# Patient Record
Sex: Female | Born: 2000 | Race: White | Hispanic: No | Marital: Single | Smoking: Never smoker
Health system: Southern US, Community
[De-identification: ages and names within clinical notes are randomized; demographics above are authoritative.]

## PROBLEM LIST (undated history)

## (undated) DIAGNOSIS — T7840XA Allergy, unspecified, initial encounter: Secondary | ICD-10-CM

## (undated) HISTORY — DX: Allergy, unspecified, initial encounter: T78.40XA

## (undated) NOTE — *Deleted (*Deleted)
Preventive Care 18-21 Years Old, Female Preventive care refers to lifestyle choices and visits with your health care provider that can promote health and wellness. At this stage in your life, you may start seeing a primary care physician instead of a pediatrician. Your health care is now your responsibility. Preventive care for young adults includes:  A yearly physical exam. This is also called an annual wellness visit.  Regular dental and eye exams.  Immunizations.  Screening for certain conditions.  Healthy lifestyle choices, such as diet and exercise. What can I expect for my preventive care visit? Physical exam Your health care provider may check:  Height and weight. These may be used to calculate body mass index (BMI), which is a measurement that tells if you are at a healthy weight.  Heart rate and blood pressure.  Body temperature. Counseling Your health care provider may ask you questions about:  Past medical problems and family medical history.  Alcohol, tobacco, and drug use.  Home and relationship well-being.  Access to firearms.  Emotional well-being.  Diet, exercise, and sleep habits.  Sexual activity and sexual health.  Method of birth control.  Menstrual cycle.  Pregnancy history. What immunizations do I need?  Influenza (flu) vaccine  This is recommended every year. Tetanus, diphtheria, and pertussis (Tdap) vaccine  You may need a Td booster every 10 years. Varicella (chickenpox) vaccine  You may need this vaccine if you have not already been vaccinated. Human papillomavirus (HPV) vaccine  If recommended by your health care provider, you may need three doses over 6 months. Measles, mumps, and rubella (MMR) vaccine  You may need at least one dose of MMR. You may also need a second dose. Meningococcal conjugate (MenACWY) vaccine  One dose is recommended if you are 19-21 years old and a first-year college student living in a residence hall,  or if you have one of several medical conditions. You may also need additional booster doses. Pneumococcal conjugate (PCV13) vaccine  You may need this if you have certain conditions and were not previously vaccinated. Pneumococcal polysaccharide (PPSV23) vaccine  You may need one or two doses if you smoke cigarettes or if you have certain conditions. Hepatitis A vaccine  You may need this if you have certain conditions or if you travel or work in places where you may be exposed to hepatitis A. Hepatitis B vaccine  You may need this if you have certain conditions or if you travel or work in places where you may be exposed to hepatitis B. Haemophilus influenzae type b (Hib) vaccine  You may need this if you have certain risk factors. You may receive vaccines as individual doses or as more than one vaccine together in one shot (combination vaccines). Talk with your health care provider about the risks and benefits of combination vaccines. What tests do I need? Blood tests  Lipid and cholesterol levels. These may be checked every 5 years starting at age 20.  Hepatitis C test.  Hepatitis B test. Screening  Pelvic exam and Pap test. This may be done every 3 years starting at age 21.  Sexually transmitted disease (STD) testing, if you are at risk.  BRCA-related cancer screening. This may be done if you have a family history of breast, ovarian, tubal, or peritoneal cancers. Other tests  Tuberculosis skin test.  Vision and hearing tests.  Skin exam.  Breast exam. Follow these instructions at home: Eating and drinking   Eat a diet that includes fresh fruits and   vegetables, whole grains, lean protein, and low-fat dairy products.  Drink enough fluid to keep your urine pale yellow.  Do not drink alcohol if: ? Your health care provider tells you not to drink. ? You are pregnant, may be pregnant, or are planning to become pregnant. ? You are under the legal drinking age. In the  [place] the legal drinking age is 21.  If you drink alcohol: ? Limit how much you have to 0-1 drink a day. ? Be aware of how much alcohol is in your drink. In the [place] one drink equals one 12 oz bottle of beer (355 mL), one 5 oz glass of wine (148 mL), or one 1 oz glass of hard liquor (44 mL). Lifestyle  Take daily care of your teeth and gums.  Stay active. Exercise at least 30 minutes 5 or more days of the week.  Do not use any products that contain nicotine or tobacco, such as cigarettes, e-cigarettes, and chewing tobacco. If you need help quitting, ask your health care provider.  Do not use drugs.  If you are sexually active, practice safe sex. Use a condom or other form of birth control (contraception) in order to prevent pregnancy and STIs (sexually transmitted infections). If you plan to become pregnant, see your health care provider for a pre-conception visit.  Find healthy ways to cope with stress, such as: ? Meditation, yoga, or listening to music. ? Journaling. ? Talking to a trusted person. ? Spending time with friends and family. Safety  Always wear your seat belt while driving or riding in a vehicle.  Do not drive if you have been drinking alcohol. Do not ride with someone who has been drinking.  Do not drive when you are tired or distracted. Do not text while driving.  Wear a helmet and other protective equipment during sports activities.  If you have firearms in your house, make sure you follow all gun safety procedures.  Seek help if you have been bullied, physically abused, or sexually abused.  Use the Internet responsibly to avoid dangers such as online bullying and online sex predators. What's next?  Go to your health care provider once a year for a well check visit.  Ask your health care provider how often you should have your eyes and teeth checked.  Stay up to date on all vaccines. This information is not intended to replace advice given to you by  your health care provider. Make sure you discuss any questions you have with your health care provider. Document Revised: 08/04/2018 Document Reviewed: 08/04/2018 Elsevier Patient Education  2020 Elsevier Inc.  

---
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
## 2020-02-16 ENCOUNTER — Other Ambulatory Visit: Payer: Self-pay

## 2020-02-16 ENCOUNTER — Ambulatory Visit (INDEPENDENT_AMBULATORY_CARE_PROVIDER_SITE_OTHER): Payer: BC Managed Care – PPO | Admitting: Family Medicine

## 2020-02-16 ENCOUNTER — Encounter: Payer: Self-pay | Admitting: Family Medicine

## 2020-02-16 VITALS — BP 114/72 | HR 69 | Temp 97.1°F | Wt 110.0 lb

## 2020-02-16 DIAGNOSIS — Z309 Encounter for contraceptive management, unspecified: Secondary | ICD-10-CM | POA: Diagnosis not present

## 2020-02-16 MED ORDER — DROSPIRENONE-ETHINYL ESTRADIOL 3-0.02 MG PO TABS: 1.0000 | ORAL_TABLET | Freq: Every day | ORAL | 11 refills | Status: DC

## 2020-02-16 NOTE — Progress Notes (Signed)
I,Laura E Walsh,acting as a scribe for Lavon Paganini, MD.,have documented all relevant documentation on the behalf of Lavon Paganini, MD,as directed by  Lavon Paganini, MD while in the presence of Lavon Paganini, MD.    Established patient visit   Patient: Diamond Hoffman   DOB: [birth year]   19 y.o. Female  MRN: [id number] Visit Date: 02/16/2020  Today's healthcare provider: Lavon Paganini, MD   Chief Complaint  Patient presents with  . Contraception   Subjective    HPI  Pt would like to discuss options for OCP.  She states her current birth control is break through bleeding and bloating.  Libido is low as well.  Taking OCPs with good compliance without missed doses.  No h/o DVT/PE, nonsmoker  Patient Active Problem List   Diagnosis Date Noted  . Encounter for contraceptive management 02/16/2020   Social History   Tobacco Use  . Smoking status: Never Smoker  . Smokeless tobacco: Never Used  Vaping Use  . Vaping Use: Some days  Substance Use Topics  . Alcohol use: No  . Drug use: No   Allergies  Allergen Reactions  . Augmentin [Amoxicillin-Pot Clavulanate] Nausea And Vomiting  . Latex Rash     Medications: Outpatient Medications Prior to Visit  Medication Sig  . Norethindrone Acetate-Ethinyl Estradiol (JUNEL 1.5/30) 1.5-30 MG-MCG tablet Take 1 tablet by mouth daily.   No facility-administered medications prior to visit.    Review of Systems  Constitutional: Negative.   Genitourinary: Positive for menstrual problem. Negative for decreased urine volume, difficulty urinating, dyspareunia, dysuria, enuresis, flank pain, frequency, genital sores, hematuria, pelvic pain, urgency, vaginal bleeding, vaginal discharge and vaginal pain.      Objective    BP 114/72 (BP Location: Left Arm, Patient Position: Sitting, Cuff Size: Normal)   Pulse 69   Temp (!) 97.1 F (36.2 C) (Temporal)   Wt 110 lb (49.9 kg)   SpO2 99%   BMI 20.45 kg/m     Physical Exam Vitals and nursing note reviewed.  Constitutional:      Appearance: Normal appearance.  Cardiovascular:     Rate and Rhythm: Normal rate and regular rhythm.  Pulmonary:     Effort: Pulmonary effort is normal.     Breath sounds: Normal breath sounds.  Musculoskeletal:     Right lower leg: No edema.     Left lower leg: No edema.  Skin:    General: Skin is warm and dry.  Neurological:     Mental Status: She is alert and oriented to person, place, and time. Mental status is at baseline.  Psychiatric:        Mood and Affect: Mood normal.        Behavior: Behavior normal.        Thought Content: Thought content normal.        Judgment: Judgment normal.     No results found for any visits on 02/16/20.  Assessment & Plan     Problem List Items Addressed This Visit      Other   Encounter for contraceptive management - Primary    Side effects and BTB on Junel Will switch to Yaz instead No contraindications Advised on importance of compliance      Relevant Medications   drospirenone-ethinyl estradiol (YAZ) 3-0.02 MG tablet       Return in about 5 months (around 07/18/2020) for CPE.      I, Lavon Paganini, MD, have reviewed all documentation for  this visit. The documentation on 02/16/20 for the exam, diagnosis, procedures, and orders are all accurate and complete.   Philo Kurtz, Marzella Schlein, MD, MPH [facility]

## 2020-02-16 NOTE — Assessment & Plan Note (Signed)
Side effects and BTB on Junel Will switch to Yaz instead No contraindications Advised on importance of compliance

## 2020-02-16 NOTE — Patient Instructions (Signed)
Oral Contraception Use Oral contraceptive pills (OCPs) are medicines that you take to prevent pregnancy. OCPs work by:  Preventing the ovaries from releasing eggs.  Thickening mucus in the lower part of the uterus (cervix), which prevents sperm from entering the uterus.  Thinning the lining of the uterus (endometrium), which prevents a fertilized egg from attaching to the endometrium. OCPs are highly effective when taken exactly as prescribed. However, OCPs do not prevent sexually transmitted infections (STIs). Safe sex practices, such as using condoms while on an OCP, can help prevent STIs. Before taking OCPs, you may have a physical exam, blood test, and Pap test. A Pap test involves taking a sample of cells from your cervix to check for cancer. Discuss with your health care provider the possible side effects of the OCP you may be prescribed. When you start an OCP, be aware that it can take 2-3 months for your body to adjust to changes in hormone levels. How to take oral contraceptive pills Follow instructions from your health care provider about how to start taking your first cycle of OCPs. Your health care provider may recommend that you:  Start the pill on day 1 of your menstrual period. If you start at this time, you will not need any backup form of birth control (contraception), such as condoms.  Start the pill on the first Sunday after your menstrual period or on the day you get your prescription. In these cases, you will need to use backup contraception for the first week.  Start the pill at any time of your cycle. ? If you take the pill within 5 days of the start of your period, you will not need a backup form of contraception. ? If you start at any other time of your menstrual cycle, you will need to use another form of contraception for 7 days. If your OCP is the type called a minipill, it will protect you from pregnancy after taking it for 2 days (48 hours), and you can stop using  backup contraception after that time. After you have started taking OCPs:  If you forget to take 1 pill, take it as soon as you remember. Take the next pill at the regular time.  If you miss 2 or more pills, call your health care provider. Different pills have different instructions for missed doses. Use backup birth control until your next menstrual period starts.  If you use a 28-day pack that contains inactive pills and you miss 1 of the last 7 pills (pills with no hormones), throw away the rest of the non-hormone pills and start a new pill pack. No matter which day you start the OCP, you will always start a new pack on that same day of the week. Have an extra pack of OCPs and a backup contraceptive method available in case you miss some pills or lose your OCP pack. Follow these instructions at home:  Do not use any products that contain nicotine or tobacco, such as cigarettes and e-cigarettes. If you need help quitting, ask your health care provider.  Always use a condom to protect against STIs. OCPs do not protect against STIs.  Use a calendar to mark the days of your menstrual period.  Read the information and directions that came with your OCP. Talk to your health care provider if you have questions. Contact a health care provider if:  You develop nausea and vomiting.  You have abnormal vaginal discharge or bleeding.  You develop a rash.    You miss your menstrual period. Depending on the type of OCP you are taking, this may be a sign of pregnancy. Ask your health care provider for more information.  You are losing your hair.  You need treatment for mood swings or depression.  You get dizzy when taking the OCP.  You develop acne after taking the OCP.  You become pregnant or think you may be pregnant.  You have diarrhea, constipation, and abdominal pain or cramps.  You miss 2 or more pills. Get help right away if:  You develop chest pain.  You develop shortness of  breath.  You have an uncontrolled or severe headache.  You develop numbness or slurred speech.  You develop visual or speech problems.  You develop pain, redness, and swelling in your legs.  You develop weakness or numbness in your arms or legs. Summary  Oral contraceptive pills (OCPs) are medicines that you take to prevent pregnancy.  OCPs do not prevent sexually transmitted infections (STIs). Always use a condom to protect against STIs.  When you start an OCP, be aware that it can take 2-3 months for your body to adjust to changes in hormone levels.  Read all the information and directions that come with your OCP. This information is not intended to replace advice given to you by your health care provider. Make sure you discuss any questions you have with your health care provider. Document Revised: 12/02/2018 Document Reviewed: 09/21/2016 Elsevier Patient Education  2020 Elsevier Inc.  

## 2020-03-11 ENCOUNTER — Other Ambulatory Visit: Payer: Self-pay | Admitting: Family Medicine

## 2020-03-11 DIAGNOSIS — Z309 Encounter for contraceptive management, unspecified: Secondary | ICD-10-CM

## 2020-03-11 NOTE — Telephone Encounter (Signed)
Medication: drospirenone-ethinyl estradiol (YAZ) 3-0.02 MG tablet [[id number]]   Has the patient contacted their pharmacy? Yes  (Agent: If no, request that the patient contact the pharmacy for the refill.) (Agent: If yes, when and what did the pharmacy advise?)  Preferred Pharmacy (with phone number or street name): [id number] - [place], [place] - [address]  Phone:  [phone] Fax:  [phone]     Agent: Please be advised that RX refills may take up to 3 business days. We ask that you follow-up with your pharmacy.

## 2020-04-09 ENCOUNTER — Telehealth (INDEPENDENT_AMBULATORY_CARE_PROVIDER_SITE_OTHER): Payer: BC Managed Care – PPO | Admitting: Physician Assistant

## 2020-04-09 DIAGNOSIS — Z20822 Contact with and (suspected) exposure to covid-19: Secondary | ICD-10-CM

## 2020-04-09 NOTE — Progress Notes (Signed)
MyChart Video Visit    Virtual Visit via Video Note   This visit type was conducted due to national recommendations for restrictions regarding the COVID-19 Pandemic (e.g. social distancing) in an effort to limit this patient's exposure and mitigate transmission in our community. This patient is at least at moderate risk for complications without adequate follow up. This format is felt to be most appropriate for this patient at this time. Physical exam was limited by quality of the video and audio technology used for the visit.   Patient location: Home Provider location: Office    I discussed the limitations of evaluation and management by telemedicine and the availability of in person appointments. The patient expressed understanding and agreed to proceed.   Patient: Diamond Hoffman   DOB: 19-May-2001   19 y.o. Female  MRN: [id number] Visit Date: 04/09/2020  Today's healthcare provider: Trey Sailors, PA-C   Chief Complaint  Patient presents with  . Cough  . Sore Throat   Subjective    HPI  Patient presents today with URI symptoms. She has had symptoms for about 1 days. She was seen in the urgent care on 8/16 and was diagnosed with acute pharyngitis. She was tested for strep and it was negative. She reports that she still has sore throat and cough is work. She was not able to go to work today. She has had COVID 06/2019. She has not been vaccinated against COVID. She was tested for COVID on 04/06/2020.     Medications: Outpatient Medications Prior to Visit  Medication Sig  . drospirenone-ethinyl estradiol (YAZ) 3-0.02 MG tablet Take 1 tablet by mouth daily.   No facility-administered medications prior to visit.    Review of Systems  Constitutional: Positive for appetite change and fatigue.  HENT: Positive for congestion, postnasal drip, sore throat and trouble swallowing.   Respiratory: Positive for cough.   Gastrointestinal: Negative.   Musculoskeletal: Negative  for myalgias.  Neurological: Positive for headaches.      Objective    There were no vitals taken for this visit.   Physical Exam Constitutional:      Appearance: Normal appearance.  Pulmonary:     Effort: Pulmonary effort is normal. No respiratory distress.  Neurological:     Mental Status: She is alert.  Psychiatric:        Mood and Affect: Mood normal.        Behavior: Behavior normal.        Assessment & Plan    1. Suspected COVID-19 virus infection  - symptoms and exam c/w viral URI  - no evidence of strep pharyngitis, CAP, AOM, bacterial sinusitis, or other bacterial infection - concern for possible COVID19 infection - will send for outpatient testing - discussed need to quarantine 10 days from start of symptoms and until fever-free for at least 48 hours - discussed need to quarantine household members - discussed symptomatic management, natural course, and return precautions     Return if symptoms worsen or fail to improve.     I discussed the assessment and treatment plan with the patient. The patient was provided an opportunity to ask questions and all were answered. The patient agreed with the plan and demonstrated an understanding of the instructions.   The patient was advised to call back or seek an in-person evaluation if the symptoms worsen or if the condition fails to improve as anticipated.   ITrey Sailors, PA-C, have reviewed all documentation for this visit.  The documentation on 04/09/20 for the exam, diagnosis, procedures, and orders are all accurate and complete.  The entirety of the information documented in the History of Present Illness, Review of Systems and Physical Exam were personally obtained by me. Portions of this information were initially documented by [place], CMA and reviewed by me for thoroughness and accuracy.       Maryella Shivers [facility] [phone] (phone) [phone]  (fax)  [facility]

## 2020-05-02 ENCOUNTER — Ambulatory Visit: Payer: BC Managed Care – PPO | Admitting: Dermatology

## 2020-05-20 ENCOUNTER — Encounter: Payer: Self-pay | Admitting: Dermatology

## 2020-05-20 ENCOUNTER — Ambulatory Visit: Payer: BC Managed Care – PPO | Admitting: Dermatology

## 2020-05-20 ENCOUNTER — Other Ambulatory Visit: Payer: Self-pay

## 2020-05-20 VITALS — BP 111/69 | HR 88 | Wt 115.0 lb

## 2020-05-20 DIAGNOSIS — L700 Acne vulgaris: Secondary | ICD-10-CM | POA: Diagnosis not present

## 2020-05-20 MED ORDER — ADAPALENE 0.3 % EX GEL: CUTANEOUS | 1 refills | Status: DC

## 2020-05-20 MED ORDER — DOXYCYCLINE MONOHYDRATE 50 MG PO TABS: 50.0000 mg | ORAL_TABLET | Freq: Every day | ORAL | 1 refills | Status: DC

## 2020-05-20 MED ORDER — SPIRONOLACTONE 25 MG PO TABS: 25.0000 mg | ORAL_TABLET | Freq: Every day | ORAL | 1 refills | Status: DC

## 2020-05-20 NOTE — Patient Instructions (Addendum)
Recommend daily broad spectrum sunscreen SPF 30+ to sun-exposed areas, reapply every 2 hours as needed. Call for new or changing lesions.  Doxycycline should be taken with food to prevent nausea. Do not lay down for 30 minutes after taking. Be cautious with sun exposure and use good sun protection while on this medication. Pregnant women should not take this medication.   Spironolactone can cause increased urination and cause blood pressure to decrease. Please watch for signs of lightheadedness and be cautious when changing position. It can sometimes cause breast tenderness or an irregular period in premenopausal women. It can also increase potassium. The increase in potassium usually is not a concern unless you are taking other medicines that also increase potassium, so please be sure your doctor knows all of the other medications you are taking. This medication should not be taken  by pregnant women.   

## 2020-05-20 NOTE — Progress Notes (Signed)
   New Patient Visit  Subjective  Diamond Hoffman is a 19 y.o. female who presents for the following: New Patient (Initial Visit) (Acne, has been using OTC meds and face wash, not clearing up as she hoped. Patient has no h/o skin cancer.).  Patient presents today to re-establish care, patient has not been her in several years.  The following portions of the chart were reviewed this encounter and updated as appropriate:  Tobacco  Allergies  Meds  Problems  Med Hx  Surg Hx  Fam Hx     Review of Systems:  No other skin or systemic complaints except as noted in HPI or Assessment and Plan.  Objective  Well appearing patient in no apparent distress; mood and affect are within normal limits.  A focused examination was performed including Face. Relevant physical exam findings are noted in the Assessment and Plan.  Objective  Head - Anterior (Face): 15 + small to medium papules with comedones at mid to low face   Assessment & Plan  Acne vulgaris Head - Anterior (Face)  Start Spironolactone 25 mg 1 po QAM Start Doxycycline Monohydrate 50 mg 1 po with dinner Start adapalene 0.3% gel apply pea size amount to affected areas on face QHS  Continue Yaz as prescribed  BP: 111/69 P 88,  Weight 115 lb  Doxycycline should be taken with food to prevent nausea. Do not lay down for 30 minutes after taking. Be cautious with sun exposure and use good sun protection while on this medication. Pregnant women should not take this medication.   Spironolactone can cause increased urination and cause blood pressure to decrease. Please watch for signs of lightheadedness and be cautious when changing position. It can sometimes cause breast tenderness or an irregular period in premenopausal women. It can also increase potassium. The increase in potassium usually is not a concern unless you are taking other medicines that also increase potassium, so please be sure your doctor knows all of the other  medications you are taking. This medication should not be taken  by pregnant women.   spironolactone (ALDACTONE) 25 MG tablet - Head - Anterior (Face)  doxycycline (ADOXA) 50 MG tablet - Head - Anterior (Face)  Adapalene (DIFFERIN) 0.3 % gel - Head - Anterior (Face)  Return in 2 months (on 07/20/2020) for Acne.  Allen Norris, CMA, am acting as scribe for Armida Sans, MD . Documentation: I have reviewed the above documentation for accuracy and completeness, and I agree with the above.  Armida Sans, MD

## 2020-06-12 ENCOUNTER — Other Ambulatory Visit: Payer: Self-pay | Admitting: Dermatology

## 2020-06-12 DIAGNOSIS — L700 Acne vulgaris: Secondary | ICD-10-CM

## 2020-07-10 NOTE — Progress Notes (Deleted)
Complete physical exam   Patient: Diamond Hoffman   DOB: 2001/08/23   19 y.o. Female  MRN: [id number] Visit Date: 07/11/2020  Today's healthcare provider: Shirlee Latch, MD   No chief complaint on file.  Subjective    Diamond Hoffman is a 19 y.o. female who presents today for a complete physical exam.  She reports consuming a {diet types:17450} diet. {Exercise:19826} She generally feels {well/fairly well/poorly:18703}. She reports sleeping {well/fairly well/poorly:18703}. She {does/does not:200015} have additional problems to discuss today.  HPI  ***  Past Medical History:  Diagnosis Date  . Allergy    No past surgical history on file. Social History   Socioeconomic History  . Marital status: Single    Spouse name: Not on file  . Number of children: Not on file  . Years of education: Not on file  . Highest education level: Not on file  Occupational History  . Not on file  Tobacco Use  . Smoking status: Never Smoker  . Smokeless tobacco: Never Used  Vaping Use  . Vaping Use: Some days  Substance and Sexual Activity  . Alcohol use: No  . Drug use: No  . Sexual activity: Not on file  Other Topics Concern  . Not on file  Social History Narrative  . Not on file   Social Determinants of Health   Financial Resource Strain:   . Difficulty of Paying Living Expenses: Not on file  Food Insecurity:   . Worried About Programme researcher, broadcasting/film/video in the Last Year: Not on file  . Ran Out of Food in the Last Year: Not on file  Transportation Needs:   . Lack of Transportation (Medical): Not on file  . Lack of Transportation (Non-Medical): Not on file  Physical Activity:   . Days of Exercise per Week: Not on file  . Minutes of Exercise per Session: Not on file  Stress:   . Feeling of Stress : Not on file  Social Connections:   . Frequency of Communication with Friends and Family: Not on file  . Frequency of Social Gatherings with Friends and Family: Not on file  .  Attends Religious Services: Not on file  . Active Member of Clubs or Organizations: Not on file  . Attends Banker Meetings: Not on file  . Marital Status: Not on file  Intimate Partner Violence:   . Fear of Current or Ex-Partner: Not on file  . Emotionally Abused: Not on file  . Physically Abused: Not on file  . Sexually Abused: Not on file   Family Status  Relation Name Status  . Mother  Alive  . Father  Deceased  . Sister  Alive  . Brother  Alive  . MGF  (Not Specified)  . PGM  (Not Specified)  . PGF  (Not Specified)   Family History  Problem Relation Age of Onset  . Depression Mother   . Anxiety disorder Mother   . Diabetes Maternal Grandfather   . Diabetes Paternal Grandmother   . Cancer Paternal Grandfather        Throat   Allergies  Allergen Reactions  . Augmentin [Amoxicillin-Pot Clavulanate] Nausea And Vomiting  . Latex Rash    Patient Care Team: Erasmo Downer, MD as PCP - General (Family Medicine)   Medications: Outpatient Medications Prior to Visit  Medication Sig  . Adapalene (DIFFERIN) 0.3 % gel Apply pea size amount to affected areas on face at night before bed  .  doxycycline (ADOXA) 50 MG tablet Take 1 tablet (50 mg total) by mouth daily. Take with dinner  . drospirenone-ethinyl estradiol (YAZ) 3-0.02 MG tablet Take 1 tablet by mouth daily.  Marland Kitchen spironolactone (ALDACTONE) 25 MG tablet TAKE 1 TABLET BY MOUTH EVERY DAY   No facility-administered medications prior to visit.    Review of Systems  Constitutional: Negative.   HENT: Negative.   Eyes: Negative.   Respiratory: Negative.   Cardiovascular: Negative.   Gastrointestinal: Negative.   Endocrine: Negative.   Genitourinary: Negative.   Musculoskeletal: Negative.   Skin: Negative.   Allergic/Immunologic: Negative.   Neurological: Negative.   Hematological: Negative.   Psychiatric/Behavioral: Negative.     Last CBC Lab Results  Component Value Date   WBC 7.2 07/20/2017    HGB 14.1 07/20/2017   HCT 41.7 07/20/2017   MCV 88.9 07/20/2017   MCH 30.2 07/20/2017   RDW 12.7 07/20/2017   PLT 176 07/20/2017   Last metabolic panel Lab Results  Component Value Date   GLUCOSE 109 (H) 07/20/2017   NA 133 (L) 07/20/2017   K 3.7 07/20/2017   CL 101 07/20/2017   CO2 23 07/20/2017   BUN 9 07/20/2017   CREATININE 0.51 07/20/2017   GFRNONAA NOT CALCULATED 07/20/2017   GFRAA NOT CALCULATED 07/20/2017   CALCIUM 9.1 07/20/2017   PROT 8.1 07/20/2017   ALBUMIN 4.5 07/20/2017   BILITOT 0.9 07/20/2017   ALKPHOS 65 07/20/2017   AST 24 07/20/2017   ALT 13 (L) 07/20/2017   ANIONGAP 9 07/20/2017   Last thyroid functions Lab Results  Component Value Date   TSH 1.391 01/16/2017     Objective    There were no vitals taken for this visit. BP Readings from Last 3 Encounters:  05/20/20 111/69  02/16/20 114/72  10/18/19 117/79   Wt Readings from Last 3 Encounters:  05/20/20 115 lb (52.2 kg) (24 %, Z= -0.70)*  02/16/20 110 lb (49.9 kg) (16 %, Z= -1.01)*  10/18/19 106 lb 9.6 oz (48.4 kg) (11 %, Z= -1.22)*   * Growth percentiles are based on CDC (Girls, 2-20 Years) data.      Physical Exam  ***  Last depression screening scores PHQ 2/9 Scores 05/05/2019  PHQ - 2 Score 0  PHQ- 9 Score 4   Last fall risk screening Fall Risk  05/05/2019  Falls in the past year? 0  Number falls in past yr: 0  Injury with Fall? 0   Last Audit-C alcohol use screening Alcohol Use Disorder Test (AUDIT) 05/05/2019  1. How often do you have a drink containing alcohol? 1  2. How many drinks containing alcohol do you have on a typical day when you are drinking? 0  3. How often do you have six or more drinks on one occasion? 1  AUDIT-C Score 2   A score of 3 or more in women, and 4 or more in men indicates increased risk for alcohol abuse, EXCEPT if all of the points are from question 1   No results found for any visits on 07/11/20.  Assessment & Plan    Routine Health  Maintenance and Physical Exam  Exercise Activities and Dietary recommendations Goals   None     Immunization History  Administered Date(s) Administered  . Td 03/31/2012  . Tdap 03/31/2012    Health Maintenance  Topic Date Due  . Hepatitis C Screening  Never done  . INFLUENZA VACCINE  Never done  . TETANUS/TDAP  03/31/2022  . HIV  Screening  Completed    Discussed health benefits of physical activity, and encouraged her to engage in regular exercise appropriate for her age and condition.  ***  No follow-ups on file.     {provider attestation***:1}   Shirlee Latch, MD  [facility] [phone] (phone) [phone] (fax)  [facility]

## 2020-07-11 ENCOUNTER — Encounter: Payer: BC Managed Care – PPO | Admitting: Family Medicine

## 2020-07-11 DIAGNOSIS — Z0000 Encounter for general adult medical examination without abnormal findings: Secondary | ICD-10-CM

## 2020-07-11 DIAGNOSIS — Z1159 Encounter for screening for other viral diseases: Secondary | ICD-10-CM

## 2020-07-11 DIAGNOSIS — Z114 Encounter for screening for human immunodeficiency virus [HIV]: Secondary | ICD-10-CM

## 2020-07-15 ENCOUNTER — Ambulatory Visit: Payer: BC Managed Care – PPO | Admitting: Dermatology

## 2021-01-10 ENCOUNTER — Other Ambulatory Visit: Payer: Self-pay | Admitting: Family Medicine

## 2021-01-10 DIAGNOSIS — Z309 Encounter for contraceptive management, unspecified: Secondary | ICD-10-CM

## 2021-01-10 NOTE — Telephone Encounter (Signed)
Requested medication (s) are due for refill today:   Yes  Requested medication (s) are on the active medication list:   Yes  Future visit scheduled:   Yes   Last ordered: 02/16/2020 #28, 11 refills  Returned because pharmacy needs an alternative.   Pt wants YAZ.  Needs an rx   Requested Prescriptions  Pending Prescriptions Disp Refills   YAZ 3-0.02 MG tablet [Pharmacy Med Name: YAZ 28 TABLET]  0      OB/GYN:  Contraceptives Passed - 01/10/2021 12:48 PM      Passed - Last BP in normal range    BP Readings from Last 1 Encounters:  05/20/20 111/69          Passed - Valid encounter within last 12 months    Recent Outpatient Visits           9 months ago Suspected COVID-19 virus infection   [facility] [place], [place], PA-C   10 months ago Encounter for contraceptive management, unspecified type   [facility] [place], Marzella Schlein, MD   1 year ago Possible pregnancy   [facility] [place], Lavella Hammock, PA-C   1 year ago Encounter for [employer] removal   [facility], Marzella Schlein, MD   1 year ago Encounter for initial prescription of Nexplanon   [facility] [place], Marzella Schlein, MD       Future Appointments             In 2 months Bacigalupo, Marzella Schlein, MD [facility]

## 2021-03-24 ENCOUNTER — Other Ambulatory Visit: Payer: Self-pay | Admitting: Family Medicine

## 2021-03-24 ENCOUNTER — Other Ambulatory Visit: Payer: Self-pay

## 2021-03-24 ENCOUNTER — Ambulatory Visit: Payer: 59 | Admitting: Family Medicine

## 2021-03-24 VITALS — BP 99/65 | HR 77 | Temp 98.6°F | Resp 16 | Ht 62.0 in | Wt 113.0 lb

## 2021-03-24 DIAGNOSIS — Z01419 Encounter for gynecological examination (general) (routine) without abnormal findings: Secondary | ICD-10-CM

## 2021-03-24 DIAGNOSIS — Z309 Encounter for contraceptive management, unspecified: Secondary | ICD-10-CM

## 2021-03-24 DIAGNOSIS — L568 Other specified acute skin changes due to ultraviolet radiation: Secondary | ICD-10-CM

## 2021-03-24 MED ORDER — PHEXXI 1.8-1-0.4 % VA GEL: VAGINAL | 11 refills | Status: DC

## 2021-03-24 NOTE — Progress Notes (Signed)
Established patient visit   Patient: Diamond Hoffman   DOB: Jul 27, 2001   20 y.o. Female  MRN: [id number] Visit Date: 03/24/2021  Today's healthcare provider: Shirlee Latch, MD   Chief Complaint  Patient presents with   Contraception   Rash   Subjective    Rash Pertinent negatives include no cough, diarrhea, eye pain, fatigue, fever, shortness of breath, sore throat or vomiting.    Follow up for contraception  The patient was last seen for this 1 years ago. Changes made at last visit include discontinuing Junel and starting on Yaz. She is sexually active. Her and her partner have spoke about pregnancy however she is not actively planning/trying to get pregnant. She is requesting for a referral to a Gyn.   She reports good compliance with treatment. She feels that condition is Unchanged. She reports her only side affect is changes in her mood.   Rash  Diamond Hoffman reports that she has had a rash X 4 days on both of her hands. She reports that it is mildly red and itchy. She has used hydrocortisone cream with minimal relief. She went to the beach and she noticed the rash shortly after. However it has improved since.      Medications: Outpatient Medications Prior to Visit  Medication Sig   drospirenone-ethinyl estradiol (YAZ) 3-0.02 MG tablet Take 1 tablet by mouth daily.   [DISCONTINUED] Adapalene (DIFFERIN) 0.3 % gel Apply pea size amount to affected areas on face at night before bed (Patient not taking: Reported on 03/24/2021)   [DISCONTINUED] doxycycline (ADOXA) 50 MG tablet Take 1 tablet (50 mg total) by mouth daily. Take with dinner (Patient not taking: Reported on 03/24/2021)   [DISCONTINUED] spironolactone (ALDACTONE) 25 MG tablet TAKE 1 TABLET BY MOUTH EVERY DAY (Patient not taking: Reported on 03/24/2021)   No facility-administered medications prior to visit.   Review of Systems  Constitutional:  Negative for activity change, chills, fatigue and fever.  HENT:   Negative for ear pain, sinus pressure, sinus pain and sore throat.   Eyes:  Negative for pain and visual disturbance.  Respiratory:  Negative for cough, chest tightness, shortness of breath and wheezing.   Cardiovascular:  Negative for chest pain, palpitations and leg swelling.  Gastrointestinal:  Negative for abdominal pain, blood in stool, diarrhea, nausea and vomiting.  Genitourinary:  Negative for dysuria, flank pain, frequency, pelvic pain and urgency.  Musculoskeletal:  Negative for back pain, myalgias and neck pain.  Skin:  Positive for rash.  Neurological:  Negative for dizziness, weakness, light-headedness, numbness and headaches.  Psychiatric/Behavioral:  Negative for self-injury, sleep disturbance and suicidal ideas. The patient is not nervous/anxious.       Objective    BP 99/65   Pulse 77   Temp 98.6 F (37 C)   Resp 16   Ht 5\' 2"  (1.575 m)   Wt 113 lb (51.3 kg)   LMP 03/20/2021 (Approximate)   BMI 20.67 kg/m     Physical Exam Vitals reviewed.  Constitutional:      General: She is not in acute distress.    Appearance: Normal appearance. She is well-developed. She is not diaphoretic.  HENT:     Head: Normocephalic and atraumatic.  Eyes:     General: No scleral icterus.    Conjunctiva/sclera: Conjunctivae normal.  Neck:     Thyroid: No thyromegaly.  Cardiovascular:     Rate and Rhythm: Normal rate and regular rhythm.     Pulses: Normal  pulses.     Heart sounds: Normal heart sounds. No murmur heard. Pulmonary:     Effort: Pulmonary effort is normal. No respiratory distress.     Breath sounds: Normal breath sounds. No wheezing, rhonchi or rales.  Musculoskeletal:     Cervical back: Neck supple.     Right lower leg: No edema.     Left lower leg: No edema.  Lymphadenopathy:     Cervical: No cervical adenopathy.  Skin:    General: Skin is warm and dry.     Findings: No rash.  Neurological:     Mental Status: She is alert and oriented to person, place, and  time. Mental status is at baseline.  Psychiatric:        Mood and Affect: Mood normal.        Behavior: Behavior normal.     No results found for any visits on 03/24/21.  Assessment & Plan     1. Encounter for contraceptive management, unspecified type - patient would like to stop hormonal birth control - she continues to have side effects on her mood - discussed phexxi - patient will try and then will d/c OCPs  2. Photodermatitis due to sun - new problem related to sun exposure over the weekend - resolving without intervention  3. Well woman exam - patient would like to see GYN for well woman exam - Ambulatory referral to Gynecology   Return in about 6 months (around 09/24/2021) for CPE.      I,Essence Turner,acting as a Neurosurgeon for Shirlee Latch, MD.,have documented all relevant documentation on the behalf of Shirlee Latch, MD,as directed by  Shirlee Latch, MD while in the presence of Shirlee Latch, MD.  I, Shirlee Latch, MD, have reviewed all documentation for this visit. The documentation on 03/24/21 for the exam, diagnosis, procedures, and orders are all accurate and complete.   Kylah Maresh, Marzella Schlein, MD, MPH [facility]

## 2021-03-24 NOTE — Telephone Encounter (Signed)
Please review. Thanks!  

## 2021-04-04 ENCOUNTER — Other Ambulatory Visit: Payer: Self-pay | Admitting: Family Medicine

## 2021-04-04 DIAGNOSIS — Z309 Encounter for contraceptive management, unspecified: Secondary | ICD-10-CM

## 2021-04-30 ENCOUNTER — Encounter: Payer: 59 | Admitting: Obstetrics and Gynecology

## 2021-06-09 ENCOUNTER — Encounter: Payer: 59 | Admitting: Obstetrics and Gynecology

## 2021-06-09 NOTE — Progress Notes (Deleted)
   PCP:  Erasmo Downer, MD   No chief complaint on file.    HPI:      Ms. Diamond Hoffman is a 20 y.o. No obstetric history on file. whose LMP was No LMP recorded. (Menstrual status: Oral contraceptives)., presents today for her NP annual examination, referred by PCP.  Her menses are {norm/abn:715}, lasting {number: 22536} days.  Dysmenorrhea {dysmen:716}. She {does:18564} have intermenstrual bleeding.  Sex activity: {sex active: 315163}.  Last Pap: {SNKN:397673419}  Results were: {norm/abn:16707::"no abnormalities"} /neg HPV DNA *** Hx of STDs: {STD hx:14358}  Last mammogram: {date:304500300}  Results were: {norm/abn:13465} There is no FH of breast cancer. There is no FH of ovarian cancer. The patient {does:18564} do self-breast exams.  Tobacco use: {tob:20664} Alcohol use: {Alcohol:11675} No drug use.  Exercise: {exercise:31265}  She {does:18564} get adequate calcium and Vitamin D in her diet.  Past Medical History:  Diagnosis Date   Allergy     No past surgical history on file.  Family History  Problem Relation Age of Onset   Depression Mother    Anxiety disorder Mother    Diabetes Maternal Grandfather    Diabetes Paternal Grandmother    Cancer Paternal Grandfather        Throat    Social History   Socioeconomic History   Marital status: Single    Spouse name: Not on file   Number of children: Not on file   Years of education: Not on file   Highest education level: Not on file  Occupational History   Not on file  Tobacco Use   Smoking status: Never   Smokeless tobacco: Never  Vaping Use   Vaping Use: Some days  Substance and Sexual Activity   Alcohol use: No   Drug use: No   Sexual activity: Not on file  Other Topics Concern   Not on file  Social History Narrative   Not on file   Social Determinants of Health   Financial Resource Strain: Not on file  Food Insecurity: Not on file  Transportation Needs: Not on file  Physical Activity:  Not on file  Stress: Not on file  Social Connections: Not on file  Intimate Partner Violence: Not on file     Current Outpatient Medications:    NIKKI 3-0.02 MG tablet, TAKE 1 TABLET BY MOUTH EVERY DAY, Disp: 84 tablet, Rfl: 1   PHEXXI 1.8-1-0.4 % GEL, PLACE VAGINALLY BEFORE INTERCOURSE, Disp: 60 g, Rfl: 11     ROS:  Review of Systems BREAST: No symptoms   Objective: There were no vitals taken for this visit.   OBGyn Exam  Results: No results found for this or any previous visit (from the past 24 hour(s)).  Assessment/Plan: No diagnosis found.  No orders of the defined types were placed in this encounter.            GYN counsel {counseling: 16159}     F/U  No follow-ups on file.  Cavin Longman B. Jarren Para, PA-C 06/09/2021 11:47 AM

## 2021-09-26 ENCOUNTER — Encounter: Payer: 59 | Admitting: Family Medicine

## 2021-09-26 NOTE — Progress Notes (Deleted)
Complete physical exam   Patient: Diamond Hoffman   DOB: 05-22-2001   21 y.o. Female  MRN: [id number] Visit Date: 09/26/2021  Today's healthcare provider: Shirlee Latch, MD   No chief complaint on file.  Subjective    Diamond Hoffman is a 21 y.o. female who presents today for a complete physical exam.  She reports consuming a {diet types:17450} diet. {Exercise:19826} She generally feels {well/fairly well/poorly:18703}. She reports sleeping {well/fairly well/poorly:18703}. She {does/does not:200015} have additional problems to discuss today.  HPI  ***  Past Medical History:  Diagnosis Date   Allergy    No past surgical history on file. Social History   Socioeconomic History   Marital status: Single    Spouse name: Not on file   Number of children: Not on file   Years of education: Not on file   Highest education level: Not on file  Occupational History   Not on file  Tobacco Use   Smoking status: Never   Smokeless tobacco: Never  Vaping Use   Vaping Use: Some days  Substance and Sexual Activity   Alcohol use: No   Drug use: No   Sexual activity: Not on file  Other Topics Concern   Not on file  Social History Narrative   Not on file   Social Determinants of Health   Financial Resource Strain: Not on file  Food Insecurity: Not on file  Transportation Needs: Not on file  Physical Activity: Not on file  Stress: Not on file  Social Connections: Not on file  Intimate Partner Violence: Not on file   Family Status  Relation Name Status   Mother  Alive   Father  Deceased   Sister  Alive   Brother  Alive   MGF  (Not Specified)   PGM  (Not Specified)   PGF  (Not Specified)   Family History  Problem Relation Age of Onset   Depression Mother    Anxiety disorder Mother    Diabetes Maternal Grandfather    Diabetes Paternal Grandmother    Cancer Paternal Grandfather        Throat   Allergies  Allergen Reactions   Augmentin [Amoxicillin-Pot  Clavulanate] Nausea And Vomiting   Latex Rash    Patient Care Team: Erasmo Downer, MD as PCP - General (Family Medicine)   Medications: Outpatient Medications Prior to Visit  Medication Sig   NIKKI 3-0.02 MG tablet TAKE 1 TABLET BY MOUTH EVERY DAY   PHEXXI 1.8-1-0.4 % GEL PLACE VAGINALLY BEFORE INTERCOURSE   No facility-administered medications prior to visit.    Review of Systems  {Labs   Heme   Chem   Endocrine   Serology   Results Review (optional):23779}  Objective    There were no vitals taken for this visit. {Show previous vital signs (optional):23777}  Physical Exam  ***  Last depression screening scores PHQ 2/9 Scores 03/24/2021 05/05/2019  PHQ - 2 Score 0 0  PHQ- 9 Score - 4   Last fall risk screening Fall Risk  03/24/2021  Falls in the past year? 0  Number falls in past yr: 0  Injury with Fall? 0  Risk for fall due to : No Fall Risks  Follow up Falls evaluation completed   Last Audit-C alcohol use screening Alcohol Use Disorder Test (AUDIT) 05/05/2019  1. How often do you have a drink containing alcohol? 1  2. How many drinks containing alcohol do you have on a typical day  when you are drinking? 0  3. How often do you have six or more drinks on one occasion? 1  AUDIT-C Score 2   A score of 3 or more in women, and 4 or more in men indicates increased risk for alcohol abuse, EXCEPT if all of the points are from question 1   No results found for any visits on 09/26/21.  Assessment & Plan    Routine Health Maintenance and Physical Exam  Exercise Activities and Dietary recommendations  Goals   None     Immunization History  Administered Date(s) Administered   Td 03/31/2012   Tdap 03/31/2012    Health Maintenance  Topic Date Due   HPV VACCINES (1 - 2-dose series) Never done   HIV Screening  Never done   Hepatitis C Screening  Never done   INFLUENZA VACCINE  Never done   PAP-Cervical Cytology Screening  09/02/2021   PAP SMEAR-Modifier   09/02/2021   TETANUS/TDAP  03/31/2022    Discussed health benefits of physical activity, and encouraged her to engage in regular exercise appropriate for her age and condition.  ***  No follow-ups on file.     {provider attestation***:1}   Shirlee Latch, MD  [facility] [phone] (phone) [phone] (fax)  [facility]

## 2021-11-18 ENCOUNTER — Encounter: Payer: Self-pay | Admitting: Family Medicine

## 2021-11-18 ENCOUNTER — Other Ambulatory Visit: Payer: Self-pay

## 2021-11-18 ENCOUNTER — Ambulatory Visit: Payer: 59 | Admitting: Family Medicine

## 2021-11-18 VITALS — BP 107/71 | HR 92 | Temp 98.8°F | Resp 16 | Ht 62.0 in | Wt 118.5 lb

## 2021-11-18 DIAGNOSIS — R197 Diarrhea, unspecified: Secondary | ICD-10-CM

## 2021-11-18 DIAGNOSIS — R140 Abdominal distension (gaseous): Secondary | ICD-10-CM | POA: Diagnosis not present

## 2021-11-18 NOTE — Progress Notes (Signed)
?  ? ? ?  Established patient visit ? ? ?Patient: Diamond Hoffman   DOB: 2001-06-02   21 y.o. Female  MRN: [id number] ?Visit Date: 11/18/2021 ? ?Today's healthcare provider: Shirlee Latch, MD  ? ?Chief Complaint  ?Patient presents with  ? Form Completion  ? ?Subjective  ?  ?HPI  ?Patient here to have a health assessment form to be fill out. ? ?Abd bloating and diarrhea intermittently after eating bread. Wonders if gluten intolerant. No family history of celiac disease.  No blood in stool. ? ?Medications: ?Outpatient Medications Prior to Visit  ?Medication Sig  ? [DISCONTINUED] NIKKI 3-0.02 MG tablet TAKE 1 TABLET BY MOUTH EVERY DAY  ? [DISCONTINUED] PHEXXI 1.8-1-0.4 % GEL PLACE VAGINALLY BEFORE INTERCOURSE  ? ?No facility-administered medications prior to visit.  ? ? ?Review of Systems per HPI ? ? ?  Objective  ?  ?BP 107/71 (BP Location: Right Arm, Patient Position: Sitting, Cuff Size: Normal)   Pulse 92   Temp 98.8 ?F (37.1 ?C) (Oral)   Resp 16   Ht 5\' 2"  (1.575 m)   Wt 118 lb 8 oz (53.8 kg)   LMP 09/28/2021   BMI 21.67 kg/m?  ? ? ?Physical Exam ?Vitals reviewed.  ?Constitutional:   ?   General: She is not in acute distress. ?   Appearance: Normal appearance. She is well-developed. She is not diaphoretic.  ?HENT:  ?   Head: Normocephalic and atraumatic.  ?Eyes:  ?   General: No scleral icterus. ?   Conjunctiva/sclera: Conjunctivae normal.  ?Neck:  ?   Thyroid: No thyromegaly.  ?Cardiovascular:  ?   Rate and Rhythm: Normal rate and regular rhythm.  ?   Pulses: Normal pulses.  ?   Heart sounds: Normal heart sounds. No murmur heard. ?Pulmonary:  ?   Effort: Pulmonary effort is normal. No respiratory distress.  ?   Breath sounds: Normal breath sounds. No wheezing, rhonchi or rales.  ?Abdominal:  ?   General: There is no distension.  ?   Palpations: Abdomen is soft.  ?   Tenderness: There is no abdominal tenderness.  ?Musculoskeletal:  ?   Cervical back: Neck supple.  ?   Right lower leg: No edema.  ?   Left  lower leg: No edema.  ?Lymphadenopathy:  ?   Cervical: No cervical adenopathy.  ?Skin: ?   General: Skin is warm and dry.  ?Neurological:  ?   Mental Status: She is alert. Mental status is at baseline.  ?Psychiatric:     ?   Mood and Affect: Mood normal.     ?   Behavior: Behavior normal.  ?  ? ? ?No results found for any visits on 11/18/21. ? Assessment & Plan  ?  ? ?1. Abdominal bloating ?2. Diarrhea, unspecified type ?- new problem x1 yr ?- no family history, but want to r/o Celiac disease ?- will send Celiac panel ?- also consider food diary and elimination diet ?- further management pending results ?- Celiac Disease Panel  ? ? 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 ?Return in about 1 year (around 11/19/2022) for CPE.  ?   ? ?I, 11/21/2022, MD, have reviewed all documentation for this visit. The documentation on 11/18/21 for the exam, diagnosis, procedures, and orders are all accurate and complete. ? ? ?11/20/21, MD, MPH ?[facility] ?[facility]   ?

## 2021-11-20 LAB — CELIAC DISEASE PANEL
Endomysial IgA: NEGATIVE
IgA/Immunoglobulin A, Serum: 329 mg/dL (ref 87–352)
Transglutaminase IgA: <2 U/mL (ref 0–3)

## 2022-03-10 ENCOUNTER — Ambulatory Visit: Payer: Self-pay

## 2022-03-10 NOTE — Telephone Encounter (Signed)
FYI

## 2022-03-10 NOTE — Telephone Encounter (Signed)
     Chief Complaint: Pt. Has fainted 12 times in the past 2 weeks, has a headache today. Eating and drinking well. Symptoms: Will feel dizzy and nauseated and then faint. This has been witnessed. No seizure activity. Frequency: 2 weeks Pertinent Negatives: Patient denies chest pain or SOB. Disposition: [x] ED /[] Urgent Care (no appt availability in office) / [] Appointment(In office/virtual)/ []  [facility]/ [] Home Care/ [] Refused Recommended Disposition /[] [facility]/ []  Follow-up with PCP Additional Notes:   Reason for Disposition  Patient sounds very sick or weak to the triager  Answer Assessment - Initial Assessment Questions 1. ONSET: "How long were you unconscious?" (minutes) "When did it happen?"     2-3 weeks  2. CONTENT: "What happened during period of unconsciousness?" (e.g., seizure activity)      No 3. MENTAL STATUS: "Alert and oriented now?" (oriented x 3 = name, month, location)      Alert  4. TRIGGER: "What do you think caused the fainting?" "What were you doing just before you fainted?"  (e.g., exercise, sudden standing up, prolonged standing)     Feels nausea and dizzy 5. RECURRENT SYMPTOM: "Have you ever passed out before?" If Yes, ask: "When was the last time?" and "What happened that time?"      No 6. INJURY: "Did you sustain any injury during the fall?"      No 7. CARDIAC SYMPTOMS: "Have you had any of the following symptoms: chest pain, difficulty breathing, palpitations?"     No 8. NEUROLOGIC SYMPTOMS: "Have you had any of the following symptoms: headache, numbness, vertigo, weakness?"     Headache 9. GI SYMPTOMS: "Have you had any of the following symptoms: abdomen pain, vomiting, diarrhea, blood in stools?"     Saturday 10. OTHER SYMPTOMS: "Do you have any other symptoms?"       No 11. PREGNANCY: "Is there any chance you are pregnant?" "When was your last menstrual period?"       No  Protocols used: Fainting-A-AH
